# Patient Record
Sex: Female | Born: 1970
Health system: Southern US, Community
[De-identification: ages and names within clinical notes are randomized; demographics above are authoritative.]

## PROBLEM LIST (undated history)

## (undated) DIAGNOSIS — E669 Obesity, unspecified: Secondary | ICD-10-CM

## (undated) DIAGNOSIS — E785 Hyperlipidemia, unspecified: Secondary | ICD-10-CM

## (undated) DIAGNOSIS — K529 Noninfective gastroenteritis and colitis, unspecified: Secondary | ICD-10-CM

## (undated) DIAGNOSIS — E119 Type 2 diabetes mellitus without complications: Secondary | ICD-10-CM

## (undated) DIAGNOSIS — K635 Polyp of colon: Secondary | ICD-10-CM

## (undated) DIAGNOSIS — J189 Pneumonia, unspecified organism: Secondary | ICD-10-CM

## (undated) HISTORY — DX: Hyperlipidemia, unspecified: E78.5

## (undated) HISTORY — PX: ABLATION: SHX5711

## (undated) HISTORY — DX: Noninfective gastroenteritis and colitis, unspecified: K52.9

## (undated) HISTORY — DX: Pneumonia, unspecified organism: J18.9

## (undated) HISTORY — DX: Polyp of colon: K63.5

## (undated) HISTORY — DX: Obesity, unspecified: E66.9

## (undated) HISTORY — DX: Type 2 diabetes mellitus without complications: E11.9

---

## 2006-09-28 HISTORY — PX: COLONOSCOPY: SHX174

## 2011-03-05 HISTORY — PX: ESOPHAGOGASTRODUODENOSCOPY: SHX1529

## 2016-03-17 HISTORY — PX: ABDOMINAL HYSTERECTOMY: SHX81

## 2017-06-01 ENCOUNTER — Ambulatory Visit (INDEPENDENT_AMBULATORY_CARE_PROVIDER_SITE_OTHER): Payer: BLUE CROSS/BLUE SHIELD | Admitting: Podiatry

## 2017-06-01 ENCOUNTER — Ambulatory Visit (INDEPENDENT_AMBULATORY_CARE_PROVIDER_SITE_OTHER): Payer: BLUE CROSS/BLUE SHIELD

## 2017-06-01 DIAGNOSIS — M79671 Pain in right foot: Secondary | ICD-10-CM

## 2017-06-01 DIAGNOSIS — M205X1 Other deformities of toe(s) (acquired), right foot: Secondary | ICD-10-CM

## 2017-06-01 DIAGNOSIS — M21962 Unspecified acquired deformity of left lower leg: Secondary | ICD-10-CM

## 2017-06-01 DIAGNOSIS — M779 Enthesopathy, unspecified: Secondary | ICD-10-CM

## 2017-06-01 DIAGNOSIS — M659 Synovitis and tenosynovitis, unspecified: Secondary | ICD-10-CM

## 2017-06-01 DIAGNOSIS — M7750 Other enthesopathy of unspecified foot: Secondary | ICD-10-CM | POA: Diagnosis not present

## 2017-06-02 NOTE — Progress Notes (Signed)
  Subjective:  Patient ID: Tammy Christensen, female    DOB: 1970-10-28,  MRN: [id number]  Chief Complaint  Patient presents with  . Toe Injury    R grea toe injury (I was falling and I put all my weight on my toe) x couple months; 5/10 achy pain Tx: none    47 y.o. female presents with the above complaint.  Reports right great toe injury couple months ago.  States that all of her weight went through her right great toe and describes a hyperextension type injury through the toe.  Reports 7 out of 10 sharp pain.  Denies prior treatments.  Denies nausea vomiting fever chills  No past medical history on file.  Current Outpatient Medications:  .  esomeprazole (NEXIUM) 20 MG packet, Take 20 mg by mouth daily before breakfast., Disp: , Rfl:   Not on File Review of Systems Objective:  There were no vitals filed for this visit. General AA&O x3. Normal mood and affect.  Vascular Dorsalis pedis and posterior tibial pulses  present 2+ bilaterally  Capillary refill normal to all digits. Pedal hair growth normal.  Neurologic Epicritic sensation grossly present.  Dermatologic No open lesions. Interspaces clear of maceration. Nails well groomed and normal in appearance.  Orthopedic: MMT 5/5 in dorsiflexion, plantarflexion, inversion, and eversion. Pain on range of motion right first MPJ.  Decreased range of motion with foot loaded.  Dorsal medial prominence noted about the first MPJ.   Assessment & Plan:  Patient was evaluated and treated and all questions answered.  Hallux limitus right first MPJ -X-rays reviewed.  Met primus elevatus with subchondral sclerosis still appreciable joint space first MPJ -Discussed etiology of hallux limitus.  Discussed the patient that this could have been exacerbated by a turf toe type injury that destabilize the joint. -Injection delivered right first MPJ as below -Would consider orthotics with possible reverse Morton's extension.  Will discuss at next  visit  Procedure: Joint Injection Location: Right first MPJ joint Skin Prep: Alcohol. Injectate: 0.5 cc 1% lidocaine plain, 0.5 cc dexamethasone phosphate. Disposition: Patient tolerated procedure well. Injection site dressed with a band-aid.    Return in about 3 weeks (around 06/22/2017) for Hallux Limitus / Turf Toe.

## 2017-06-22 ENCOUNTER — Ambulatory Visit (INDEPENDENT_AMBULATORY_CARE_PROVIDER_SITE_OTHER): Payer: BLUE CROSS/BLUE SHIELD | Admitting: Podiatry

## 2017-06-22 ENCOUNTER — Encounter: Payer: Self-pay | Admitting: Podiatry

## 2017-06-22 DIAGNOSIS — M659 Synovitis and tenosynovitis, unspecified: Secondary | ICD-10-CM | POA: Diagnosis not present

## 2017-06-22 DIAGNOSIS — M21962 Unspecified acquired deformity of left lower leg: Secondary | ICD-10-CM | POA: Diagnosis not present

## 2017-06-22 DIAGNOSIS — M205X1 Other deformities of toe(s) (acquired), right foot: Secondary | ICD-10-CM

## 2017-06-22 NOTE — Progress Notes (Signed)
  Subjective:  Patient ID: Tammy Christensen, female    DOB: September 14, 1970,  MRN: [id number]  Chief Complaint  Patient presents with  . hallux limitus    F/U Hallux limitus R 1st toe Pt. stated," it's fine, it doesn't hurt as much as it used to; 3/10 pain. Also, I've been having this burning sensation on my great toe." Tx: none   47 y.o. female returns for the above complaint.  States that the area is not hurting as much as it used to reportedly 3 of 10 pain.  Reports burning sensation to the toe that she says is deep within the joint.  Objective:  There were no vitals filed for this visit. General AA&O x3. Normal mood and affect.  Vascular Pedal pulses palpable.  Neurologic Epicritic sensation grossly intact.  Dermatologic No open lesions. Skin normal texture and turgor.  Orthopedic: Pain on ROM 1st MPJ.  Dorsal medial eminence noted about the first MPJ   Assessment & Plan:  Patient was evaluated and treated and all questions answered.  Hallux Limitus R 1st MPJ -Pain improving.  No repeat injection today. -Discussed proper shoe gear including wearing sneakers.  Patient states she does not wear sneakers very often and most often wears wedge shoes.  Discussed possible orthotic therapy with Morton's extension however patient states that she does not wear sneakers often enough to make this justifiable.  Will hold off at this time.  Discussed possible surgical therapy including cheilectomy however patient does not wish to proceed at this time.  Will monitor -For signs of worsening pain.  Will consider in the future.  15 minutes of face to face time were spent with the patient. >50% of this was spent on counseling and coordination of care. Specifically discussed with patient the above diagnosis and treatment plans.   Return in about 6 weeks (around 08/03/2017) for Hallux Limitus R.

## 2017-07-16 ENCOUNTER — Other Ambulatory Visit: Payer: Self-pay | Admitting: Podiatry

## 2017-07-16 DIAGNOSIS — M205X1 Other deformities of toe(s) (acquired), right foot: Secondary | ICD-10-CM

## 2017-08-03 ENCOUNTER — Ambulatory Visit: Payer: BLUE CROSS/BLUE SHIELD | Admitting: Podiatry

## 2017-12-30 ENCOUNTER — Encounter: Payer: Self-pay | Admitting: Gastroenterology

## 2018-01-04 ENCOUNTER — Encounter: Payer: Self-pay | Admitting: Gastroenterology

## 2018-01-05 ENCOUNTER — Encounter: Payer: Self-pay | Admitting: Gastroenterology

## 2018-01-05 ENCOUNTER — Ambulatory Visit (INDEPENDENT_AMBULATORY_CARE_PROVIDER_SITE_OTHER): Payer: BLUE CROSS/BLUE SHIELD | Admitting: Gastroenterology

## 2018-01-05 VITALS — BP 138/88 | HR 84 | Ht 61.0 in | Wt 225.1 lb

## 2018-01-05 DIAGNOSIS — R1310 Dysphagia, unspecified: Secondary | ICD-10-CM

## 2018-01-05 DIAGNOSIS — Z8371 Family history of colonic polyps: Secondary | ICD-10-CM | POA: Diagnosis not present

## 2018-01-05 DIAGNOSIS — K219 Gastro-esophageal reflux disease without esophagitis: Secondary | ICD-10-CM

## 2018-01-05 MED ORDER — SUPREP BOWEL PREP KIT 17.5-3.13-1.6 GM/177ML PO SOLN: 1.0000 | ORAL | 0 refills | Status: DC

## 2018-01-05 MED ORDER — PANTOPRAZOLE SODIUM 40 MG PO TBEC: 40.0000 mg | DELAYED_RELEASE_TABLET | Freq: Every day | ORAL | 11 refills | Status: AC

## 2018-01-05 NOTE — Patient Instructions (Addendum)
If you are age 48 or older, your body mass index should be between 23-30. Your Body mass index is 42.54 kg/m. If this is out of the aforementioned range listed, please consider follow up with your Primary Care Provider.  If you are age 37 or younger, your body mass index should be between 19-25. Your Body mass index is 42.54 kg/m. If this is out of the aformentioned range listed, please consider follow up with your Primary Care Provider.   You have been scheduled for an endoscopy and colonoscopy. Please follow the written instructions given to you at your visit today. Please pick up your prep supplies at the pharmacy within the next 1-3 days. If you use inhalers (even only as needed), please bring them with you on the day of your procedure. Your physician has requested that you go to www.startemmi.com and enter the access code given to you at your visit today. This web site gives a general overview about your procedure. However, you should still follow specific instructions given to you by our office regarding your preparation for the procedure.  We have sent the following medications to your pharmacy for you to pick up at your convenience: Protonix 40 mg once daily Suprep   Thank you,  Dr. Lynann Bologna

## 2018-01-05 NOTE — Progress Notes (Addendum)
Chief Complaint:   Referring Provider:  Hurshel Party, NP      ASSESSMENT AND PLAN;   #1. GERD (H/O LPR) #2. Esophageal dysphagia. Differential diagnoses includes esophageal stricture, Schatzki's ring, motility disorder, eosinophilic esophagitis, pill induced esophagitis, rule out esophageal carcinoma or extrinsic lesions. #3. FH of polyps (mom)  Plan: - Change nexium to protonix 40mg  once a day.  If she still has problems, add Zantac or Pepcid nightly. - Nonpharmacologic means of reflux control was stressed. - No sodas, chocolates, chewing gums and candy. - Weight loss-encouraged patient to continue losing weight gradually. - Proceed with EGD with possible esophageal biopsies/dilation and colonoscopy.  I have discussed the risks and benefits.  The risks including risk of perforation requiring laparotomy, bleeding after biopsies/polypectomy requiring blood transfusions and risks of anesthesia/sedation were discussed.  Rare risks of missing UGI and colorectal neoplasms were also discussed.  Alternatives were also given.  Patient is fully aware and agrees to proceed. All the questions were answered. Procedures will be scheduled in upcoming days. Consent forms were given for review. - If still with problems, consider ENT evaluation.     HPI:    Tammy Christensen is a 47 y.o. female  Seen at request of Amy Moon FNP With long-standing history of gastroesophageal reflux Despite Nexium Getting worse Has regurgitation Has been having problems with dysphagia mostly to solids in the lower neck area No melena or hematochezia Seen by Gaye Alken advised to get EGD and colonoscopy performed. Denies having any lower GI symptoms including diarrhea or constipation. No significant abdominal pain, nausea/vomiting. Has been trying to lose weight and has been able to lose 8 pounds over the last 1 year. Denies use of nonsteroidals Denies eating late at night No significant sodas, chocolates,  coffee. Used to be a CNA at [employer], now real estate agent with [employer] Likes her job Sleeping well Mom is a patient of ours Past GI procedures: -Colonoscopy 09/2006 ([facility])-negative. -EGD 02/2011-irregular Z line, biopsies negative negative for Barrett's, small bowel biopsies negative for celiac Past Medical History:  Diagnosis Date  . Colitis   . Colon polyp   . Diabetes (HCC)   . Hyperlipidemia   . Obesity   . Pneumonia     Past Surgical History:  Procedure Laterality Date  . ABDOMINAL HYSTERECTOMY  2018  . ABLATION    . COLONOSCOPY  09/28/2006   Minimal erythema ileum of questionable importance. Otherwise normal colonoscopy.   . ESOPHAGOGASTRODUODENOSCOPY  03/05/2011   Irregular Z-line suggestive of gerd. Otherwise noraml EGD.     Family History  Problem Relation Age of Onset  . Esophageal cancer Mother   . Diabetes Father   . Diabetes Paternal Grandmother   . Diabetes Paternal Grandfather   . Colon cancer Neg Hx     Social History   Tobacco Use  . Smoking status: Former Games developer  . Smokeless tobacco: Never Used  . Tobacco comment: quit 20 years ago  Substance Use Topics  . Alcohol use: Not Currently  . Drug use: Never    Current Outpatient Medications  Medication Sig Dispense Refill  . esomeprazole (NEXIUM) 20 MG packet Take 20 mg by mouth daily before breakfast.    . JANUMET XR [id number] MG TB24 1 tablet daily.   0   No current facility-administered medications for this visit.     Not on File  Review of Systems:  Constitutional: Denies fever, chills, diaphoresis, appetite change and has fatigue.  HEENT: Denies  photophobia, eye pain, redness, hearing loss, ear pain, congestion, sore throat, rhinorrhea, sneezing, mouth sores, neck pain, neck stiffness and tinnitus.   Respiratory: Has SOB, NO DOE, cough, chest tightness,  and wheezing.   Cardiovascular: Denies chest pain, palpitations and leg swelling.  Genitourinary: Denies dysuria, urgency,  frequency, hematuria, flank pain and difficulty urinating.  Musculoskeletal: Denies myalgias, back pain, joint swelling, arthralgias and gait problem.  Skin: No rash.  Neurological: Denies dizziness, seizures, syncope, weakness, light-headedness, numbness and headaches.  Hematological: Denies adenopathy. Easy bruising, personal or family bleeding history  Psychiatric/Behavioral: No anxiety or depression.  Has sleeping problems     Physical Exam:    BP 138/88   Pulse 84   Ht 5\' 1"  (1.549 m)   Wt 225 lb 2 oz (102.1 kg)   BMI 42.54 kg/m  Filed Weights   01/05/18 1041  Weight: 225 lb 2 oz (102.1 kg)   Constitutional:  Well-developed, in no acute distress. Psychiatric: Normal mood and affect. Behavior is normal. HEENT: Pupils normal.  Conjunctivae are normal. No scleral icterus. Neck supple.  Cardiovascular: Normal rate, regular rhythm. No edema Pulmonary/chest: Effort normal and breath sounds normal. No wheezing, rales or rhonchi. Abdominal: Soft, nondistended. Nontender. Bowel sounds active throughout. There are no masses palpable. No hepatomegaly. Rectal:  defered Neurological: Alert and oriented to person place and time. Skin: Skin is warm and dry. No rashes noted.    Edman Circle, MD 01/05/2018, 11:07 AM  Cc: Hurshel Party, NP

## 2018-01-11 ENCOUNTER — Encounter: Payer: Self-pay | Admitting: Gastroenterology

## 2018-01-12 ENCOUNTER — Encounter: Payer: Self-pay | Admitting: Gastroenterology

## 2018-01-12 ENCOUNTER — Ambulatory Visit (AMBULATORY_SURGERY_CENTER): Payer: BLUE CROSS/BLUE SHIELD | Admitting: Gastroenterology

## 2018-01-12 VITALS — BP 144/81 | HR 83 | Temp 97.8°F | Resp 14 | Ht 61.0 in | Wt 225.0 lb

## 2018-01-12 DIAGNOSIS — Z8371 Family history of colonic polyps: Secondary | ICD-10-CM | POA: Diagnosis not present

## 2018-01-12 DIAGNOSIS — K2970 Gastritis, unspecified, without bleeding: Secondary | ICD-10-CM

## 2018-01-12 DIAGNOSIS — Z1211 Encounter for screening for malignant neoplasm of colon: Secondary | ICD-10-CM | POA: Diagnosis not present

## 2018-01-12 DIAGNOSIS — R1319 Other dysphagia: Secondary | ICD-10-CM | POA: Diagnosis present

## 2018-01-12 MED ORDER — SODIUM CHLORIDE 0.9 % IV SOLN: 500.0000 mL | Freq: Once | INTRAVENOUS | Status: AC

## 2018-01-12 NOTE — Patient Instructions (Signed)
YOU HAD AN ENDOSCOPIC PROCEDURE TODAY AT THE [facility]:   Refer to the procedure report that was given to you for any specific questions about what was found during the examination.  If the procedure report does not answer your questions, please call your gastroenterologist to clarify.  If you requested that your care partner not be given the details of your procedure findings, then the procedure report has been included in a sealed envelope for you to review at your convenience later.  YOU SHOULD EXPECT: Some feelings of bloating in the abdomen. Passage of more gas than usual.  Walking can help get rid of the air that was put into your GI tract during the procedure and reduce the bloating. If you had a lower endoscopy (such as a colonoscopy or flexible sigmoidoscopy) you may notice spotting of blood in your stool or on the toilet paper. If you underwent a bowel prep for your procedure, you may not have a normal bowel movement for a few days.  Please Note:  You might notice some irritation and congestion in your nose or some drainage.  This is from the oxygen used during your procedure.  There is no need for concern and it should clear up in a day or so.  SYMPTOMS TO REPORT IMMEDIATELY:   Following lower endoscopy (colonoscopy or flexible sigmoidoscopy):  Excessive amounts of blood in the stool  Significant tenderness or worsening of abdominal pains  Swelling of the abdomen that is new, acute  Fever of 100F or higher   Following upper endoscopy (EGD)  Vomiting of blood or coffee ground material  New chest pain or pain under the shoulder blades  Painful or persistently difficult swallowing  New shortness of breath  Fever of 100F or higher  Black, tarry-looking stools  For urgent or emergent issues, a gastroenterologist can be reached at any hour by calling [phone].   DIET:  Post Esophageal Dilation Diet.  Drink plenty of fluids but you should avoid alcoholic  beverages for 24 hours.  ACTIVITY:  You should plan to take it easy for the rest of today and you should NOT DRIVE or use heavy machinery until tomorrow (because of the sedation medicines used during the test).    FOLLOW UP: Our staff will call the number listed on your records the next business day following your procedure to check on you and address any questions or concerns that you may have regarding the information given to you following your procedure. If we do not reach you, we will leave a message.  However, if you are feeling well and you are not experiencing any problems, there is no need to return our call.  We will assume that you have returned to your regular daily activities without incident.  If any biopsies were taken you will be contacted by phone or by letter within the next 1-3 weeks.  Please call us at [phone] if you have not heard about the biopsies in 3 weeks.  Await for biopsy results  Post esophageal Dilation Diet (handout given) Esophagitis and Stricture (handout given)  SIGNATURES/CONFIDENTIALITY: You and/or your care partner have signed paperwork which will be entered into your electronic medical record.  These signatures attest to the fact that that the information above on your After Visit Summary has been reviewed and is understood.  Full responsibility of the confidentiality of this discharge information lies with you and/or your care-partner.

## 2018-01-12 NOTE — Progress Notes (Signed)
Called to room to assist during endoscopic procedure.  Patient ID and intended procedure confirmed with present staff. Received instructions for my participation in the procedure from the performing physician.  

## 2018-01-12 NOTE — Op Note (Signed)
[facility] Patient Name: Tammy Christensen Procedure Date: 01/12/2018 11:11 AM MRN: [id number] Endoscopist: Lynann Bologna , MD Age: 47 Referring MD:  Date of Birth: [birth year] Gender: Female Account #: [id number] Procedure:                Colonoscopy Indications:              Colon cancer screening in patient at increased                            risk: Family history of 1st-degree relative with                            colon polyps before age 6 years Medicines:                Monitored Anesthesia Care Procedure:                Pre-Anesthesia Assessment:                           - Prior to the procedure, a History and Physical                            was performed, and patient medications and                            allergies were reviewed. The patient's tolerance of                            previous anesthesia was also reviewed. The risks                            and benefits of the procedure and the sedation                            options and risks were discussed with the patient.                            All questions were answered, and informed consent                            was obtained. Prior Anticoagulants: The patient has                            taken no previous anticoagulant or antiplatelet                            agents. ASA Grade Assessment: III - A patient with                            severe systemic disease. After reviewing the risks                            and benefits, the patient was deemed in  satisfactory condition to undergo the procedure.                           After obtaining informed consent, the colonoscope                            was passed under direct vision. Throughout the                            procedure, the patient's blood pressure, pulse, and                            oxygen saturations were monitored continuously. The                            Colonoscope was introduced  through the anus and                            advanced to the 2 cm into the ileum. The                            colonoscopy was performed without difficulty. The                            patient tolerated the procedure well. The quality                            of the bowel preparation was excellent. Scope In: 11:36:03 AM Scope Out: 11:45:58 AM Scope Withdrawal Time: 0 hours 7 minutes 0 seconds  Total Procedure Duration: 0 hours 9 minutes 55 seconds  Findings:                 Non-bleeding internal hemorrhoids and external                            hemorrhoids were found during retroflexion and on                            perianal exam. The hemorrhoids were small.                           The exam was otherwise without abnormality on                            direct and retroflexion views. Complications:            No immediate complications. Estimated Blood Loss:     Estimated blood loss: none. Impression:               - Small non-bleeding internal hemorrhoids.                           - The examination was otherwise normal on direct                            and retroflexion  views.                           - No specimens collected. Recommendation:           - Patient has a contact number available for                            emergencies. The signs and symptoms of potential                            delayed complications were discussed with the                            patient. Return to normal activities tomorrow.                            Written discharge instructions were provided to the                            patient.                           - Resume previous diet.                           - Continue present medications.                           - Repeat colonoscopy in 10 years for screening                            purposes.                           - Return to GI clinic PRN. Lynann Bologna, MD 01/12/2018 11:57:32 AM This report has been signed  electronically.

## 2018-01-12 NOTE — Op Note (Signed)
[facility] Patient Name: Tammy Christensen Procedure Date: 01/12/2018 11:11 AM MRN: [id number] Endoscopist: Lynann Bologna , MD Age: 47 Referring MD:  Date of Birth: May 20, 1970 Gender: Female Account #: [id number] Procedure:                Upper GI endoscopy Indications:              Gastroesophageal reflux with intermittent dysphagia. Medicines:                Monitored Anesthesia Care Procedure:                Pre-Anesthesia Assessment:                           - Prior to the procedure, a History and Physical                            was performed, and patient medications and                            allergies were reviewed. The patient's tolerance of                            previous anesthesia was also reviewed. The risks                            and benefits of the procedure and the sedation                            options and risks were discussed with the patient.                            All questions were answered, and informed consent                            was obtained. Prior Anticoagulants: The patient has                            taken no previous anticoagulant or antiplatelet                            agents. ASA Grade Assessment: III - A patient with                            severe systemic disease. After reviewing the risks                            and benefits, the patient was deemed in                            satisfactory condition to undergo the procedure.                           After obtaining informed consent, the endoscope was  passed under direct vision. Throughout the                            procedure, the patient's blood pressure, pulse, and                            oxygen saturations were monitored continuously. The                            Endoscope was introduced through the mouth, and                            advanced to the second part of duodenum. The upper   GI endoscopy was accomplished without difficulty.                            The patient tolerated the procedure well. Scope In: Scope Out: Findings:                 No endoscopic abnormality was evident in the                            esophagus to explain the patient's complaint of                            dysphagia. It was decided, however, to proceed with                            dilation of the entire esophagus. The scope was                            withdrawn. Dilation was performed with a Maloney                            dilator with no resistance at 50 Fr. Biopsies were                            taken with a cold forceps for histology to r/o EoE.                            Z line well-defined at 35 cm.                           Mild inflammation was found in the gastric antrum.                            Biopsies were taken with a cold forceps for                            histology. Estimated blood loss: none.                           The examined duodenum was normal. Complications:  No immediate complications. Estimated Blood Loss:     Estimated blood loss: none. Impression:               - Mild Gastritis. Biopsied.                           - S/P empiric esophageal dilatation. Recommendation:           - Patient has a contact number available for                            emergencies. The signs and symptoms of potential                            delayed complications were discussed with the                            patient. Return to normal activities tomorrow.                            Written discharge instructions were provided to the                            patient.                           - Post dilatation diet.                           - Continue present medications.                           - Await pathology results.                           - Return to GI office PRN. Lynann Bologna, MD 01/12/2018 11:53:09 AM This report has been  signed electronically.

## 2018-01-12 NOTE — Progress Notes (Signed)
PT taken to PACU. Monitors in place. VSS. Report given to RN. 

## 2018-01-13 ENCOUNTER — Telehealth: Payer: Self-pay

## 2018-01-13 NOTE — Telephone Encounter (Signed)
  Follow up Call-  Call back number 01/12/2018  Post procedure Call Back phone  # [phone]  Permission to leave phone message Yes     Patient questions:  Do you have a fever, pain , or abdominal swelling? No Pain Score  0  Have you tolerated food without any problems? Yes  Have you been able to return to your normal activities? Yes  Do you have any questions about your discharge instructions: Diet   No Medications  No Follow up visit  No  Do you have questions or concerns about your Care? No  Actions: * If pain score is 4 or above: "No action needed, pain <4."

## 2018-01-15 ENCOUNTER — Encounter: Payer: Self-pay | Admitting: Gastroenterology

## 2020-06-27 ENCOUNTER — Other Ambulatory Visit: Payer: Self-pay | Admitting: Internal Medicine

## 2020-06-28 ENCOUNTER — Other Ambulatory Visit: Payer: Self-pay | Admitting: Internal Medicine

## 2020-06-28 DIAGNOSIS — R928 Other abnormal and inconclusive findings on diagnostic imaging of breast: Secondary | ICD-10-CM

## 2020-07-04 ENCOUNTER — Ambulatory Visit
Admission: RE | Admit: 2020-07-04 | Discharge: 2020-07-04 | Disposition: A | Discharge status: Home / Self Care | Payer: Managed Care, Other (non HMO) | Source: Ambulatory Visit | Attending: Internal Medicine | Admitting: Internal Medicine

## 2020-07-04 ENCOUNTER — Other Ambulatory Visit: Payer: Self-pay

## 2020-07-04 DIAGNOSIS — R928 Other abnormal and inconclusive findings on diagnostic imaging of breast: Secondary | ICD-10-CM

## 2020-07-27 ENCOUNTER — Ambulatory Visit: Payer: Self-pay | Admitting: Surgery

## 2020-07-27 DIAGNOSIS — N6489 Other specified disorders of breast: Secondary | ICD-10-CM

## 2020-07-27 NOTE — H&P (View-Only) (Signed)
History of Present Illness Tammy Christensen. Tammy Blando MD; 07/27/2020 2:49 PM) The patient is a 50 year old female who presents with a breast mass. PCP - Tammy Alken NP Reason: Right breast CSL  This is a 50 year old female in good health who presents after a recent screening mammogram at [facility] revealed a possible mass in the right lateral breast. She came to [facility] for further work-up. Biopsy revealed a complex sclerosing lesion without sign of any malignancy. No family history of breast cancer. No previous breast problems. She presents now to discuss excision of this area.     Problem List/Past Medical Molli Hazard K. Levy Cedano, MD; 07/27/2020 2:49 PM) COMPLEX SCLEROSING LESION OF RIGHT BREAST (Y63.78)  Past Surgical History Michel Bickers, LPN; 5/88/5027 74:12 AM) Breast Biopsy Right.  Diagnostic Studies History Michel Bickers, LPN; 8/78/6767 20:94 AM) Mammogram within last year Pap Smear 1-5 years ago  Allergies Arbutus Ped, CMA; 07/27/2020 10:16 AM) No Known Drug Allergies [07/27/2020]: Allergies Reconciled  Medication History Arbutus Ped, CMA; 07/27/2020 10:16 AM) Trulicity (1.5MG /0.5ML Soln Pen-inj, Subcutaneous) Active. Medications Reconciled  Social History Michel Bickers, LPN; 09/22/6281 66:29 AM) Alcohol use Remotely quit alcohol use. Caffeine use Coffee. No drug use Tobacco use Former smoker.  Family History Michel Bickers, LPN; 4/76/5465 03:54 AM) Arthritis Mother. Depression Mother. Hypertension Mother. Thyroid problems Mother.  Pregnancy / Birth History Michel Bickers, LPN; 6/56/8127 51:70 AM) Age at menarche 10 years. Gravida 1 Para 1  Other Problems Tammy Christensen. Shaquon Gropp, MD; 07/27/2020 2:49 PM) Gastroesophageal Reflux Disease Hemorrhoids     Review of Systems Tresa Endo Dockery LPN; 0/17/4944 96:75 AM) General Not Present- Appetite Loss, Chills, Fatigue, Fever, Night Sweats, Weight Gain and Weight Loss. Respiratory Not Present- Bloody sputum, Chronic  Cough, Difficulty Breathing, Snoring and Wheezing. Cardiovascular Not Present- Chest Pain, Difficulty Breathing Lying Down, Leg Cramps, Palpitations, Rapid Heart Rate, Shortness of Breath and Swelling of Extremities. Gastrointestinal Present- Hemorrhoids. Not Present- Abdominal Pain, Bloating, Bloody Stool, Change in Bowel Habits, Chronic diarrhea, Constipation, Difficulty Swallowing, Excessive gas, Gets full quickly at meals, Indigestion, Nausea, Rectal Pain and Vomiting.  Vitals Arbutus Ped CMA; 07/27/2020 10:17 AM) 07/27/2020 10:16 AM Weight: 200.13 lb Height: 61in Body Surface Area: 1.89 m Body Mass Index: 37.81 kg/m  Temp.: 96.40F  Pulse: 71 (Regular)  P.OX: 98% (Room air) BP: 136/78(Sitting, Left Arm, Standard)        Physical Exam Molli Hazard K. Brindy Higginbotham MD; 07/27/2020 2:49 PM)  The physical exam findings are as follows: Note:Constitutional: WDWN in NAD, conversant, no obvious deformities; resting comfortably Eyes: Pupils equal, round; sclera anicteric; moist conjunctiva; no lid lag HENT: Oral mucosa moist; good dentition Neck: No masses palpated, trachea midline; no thyromegaly Lungs: CTA bilaterally; normal respiratory effort Breasts: symmetric; no palpable masses; no nipple changes; no axillary lymphadenopathy CV: Regular rate and rhythm; no murmurs; extremities well-perfused with no edema Abd: +bowel sounds, soft, non-tender, no palpable organomegaly; no palpable hernias Musc: Normal gait; no apparent clubbing or cyanosis in extremities Lymphatic: No palpable cervical or axillary lymphadenopathy Skin: Warm, dry; no sign of jaundice Psychiatric - alert and oriented x 4; calm mood and affect    Assessment & Plan Molli Hazard K. Jamaica Inthavong MD; 07/27/2020 10:37 AM)  COMPLEX SCLEROSING LESION OF RIGHT BREAST (N64.89)  Current Plans Schedule for Surgery - Right breast radioactive seed localized lumpectomy. The surgical procedure has been discussed with the patient. Potential  risks, benefits, alternative treatments, and expected outcomes have been explained. All of the patient's questions at this time have been answered.  The likelihood of reaching the patient's treatment goal is good. The patient understand the proposed surgical procedure and wishes to proceed.   Tammy Christensen. Corliss Skains, MD, [facility]  General/ Trauma Surgery   07/27/2020 2:50 PM

## 2020-07-27 NOTE — H&P (Signed)
History of Present Illness Tammy Christensen. Annalea Alguire MD; 07/27/2020 2:49 PM) The patient is a 50 year old female who presents with a breast mass. PCP - Gaye Alken NP Reason: Right breast CSL  This is a 50 year old female in good health who presents after a recent screening mammogram at [facility] revealed a possible mass in the right lateral breast. She came to [facility] for further work-up. Biopsy revealed a complex sclerosing lesion without sign of any malignancy. No family history of breast cancer. No previous breast problems. She presents now to discuss excision of this area.     Problem List/Past Medical Molli Hazard K. Rimsha Trembley, MD; 07/27/2020 2:49 PM) COMPLEX SCLEROSING LESION OF RIGHT BREAST (Y63.78)  Past Surgical History Michel Bickers, LPN; 5/88/5027 74:12 AM) Breast Biopsy Right.  Diagnostic Studies History Michel Bickers, LPN; 8/78/6767 20:94 AM) Mammogram within last year Pap Smear 1-5 years ago  Allergies Arbutus Ped, CMA; 07/27/2020 10:16 AM) No Known Drug Allergies [07/27/2020]: Allergies Reconciled  Medication History Arbutus Ped, CMA; 07/27/2020 10:16 AM) Trulicity (1.5MG /0.5ML Soln Pen-inj, Subcutaneous) Active. Medications Reconciled  Social History Michel Bickers, LPN; 09/22/6281 66:29 AM) Alcohol use Remotely quit alcohol use. Caffeine use Coffee. No drug use Tobacco use Former smoker.  Family History Michel Bickers, LPN; 4/76/5465 03:54 AM) Arthritis Mother. Depression Mother. Hypertension Mother. Thyroid problems Mother.  Pregnancy / Birth History Michel Bickers, LPN; 6/56/8127 51:70 AM) Age at menarche 10 years. Gravida 1 Para 1  Other Problems Tammy Christensen. Lelaina Oatis, MD; 07/27/2020 2:49 PM) Gastroesophageal Reflux Disease Hemorrhoids     Review of Systems Tresa Endo Dockery LPN; 0/17/4944 96:75 AM) General Not Present- Appetite Loss, Chills, Fatigue, Fever, Night Sweats, Weight Gain and Weight Loss. Respiratory Not Present- Bloody sputum, Chronic  Cough, Difficulty Breathing, Snoring and Wheezing. Cardiovascular Not Present- Chest Pain, Difficulty Breathing Lying Down, Leg Cramps, Palpitations, Rapid Heart Rate, Shortness of Breath and Swelling of Extremities. Gastrointestinal Present- Hemorrhoids. Not Present- Abdominal Pain, Bloating, Bloody Stool, Change in Bowel Habits, Chronic diarrhea, Constipation, Difficulty Swallowing, Excessive gas, Gets full quickly at meals, Indigestion, Nausea, Rectal Pain and Vomiting.  Vitals Arbutus Ped CMA; 07/27/2020 10:17 AM) 07/27/2020 10:16 AM Weight: 200.13 lb Height: 61in Body Surface Area: 1.89 m Body Mass Index: 37.81 kg/m  Temp.: 96.40F  Pulse: 71 (Regular)  P.OX: 98% (Room air) BP: 136/78(Sitting, Left Arm, Standard)        Physical Exam Molli Hazard K. Kaityln Kallstrom MD; 07/27/2020 2:49 PM)  The physical exam findings are as follows: Note:Constitutional: WDWN in NAD, conversant, no obvious deformities; resting comfortably Eyes: Pupils equal, round; sclera anicteric; moist conjunctiva; no lid lag HENT: Oral mucosa moist; good dentition Neck: No masses palpated, trachea midline; no thyromegaly Lungs: CTA bilaterally; normal respiratory effort Breasts: symmetric; no palpable masses; no nipple changes; no axillary lymphadenopathy CV: Regular rate and rhythm; no murmurs; extremities well-perfused with no edema Abd: +bowel sounds, soft, non-tender, no palpable organomegaly; no palpable hernias Musc: Normal gait; no apparent clubbing or cyanosis in extremities Lymphatic: No palpable cervical or axillary lymphadenopathy Skin: Warm, dry; no sign of jaundice Psychiatric - alert and oriented x 4; calm mood and affect    Assessment & Plan Molli Hazard K. Danaysia Rader MD; 07/27/2020 10:37 AM)  COMPLEX SCLEROSING LESION OF RIGHT BREAST (N64.89)  Current Plans Schedule for Surgery - Right breast radioactive seed localized lumpectomy. The surgical procedure has been discussed with the patient. Potential  risks, benefits, alternative treatments, and expected outcomes have been explained. All of the patient's questions at this time have been answered.  The likelihood of reaching the patient's treatment goal is good. The patient understand the proposed surgical procedure and wishes to proceed.   Malloree Raboin K. Khaniya Tenaglia, MD, FACS [facility]  General/ Trauma Surgery   07/27/2020 2:50 PM  

## 2020-07-31 ENCOUNTER — Other Ambulatory Visit: Payer: Self-pay | Admitting: Surgery

## 2020-07-31 DIAGNOSIS — N6489 Other specified disorders of breast: Secondary | ICD-10-CM

## 2020-08-06 NOTE — Pre-Procedure Instructions (Signed)
Surgical Instructions    Your procedure is scheduled on Wednesday, June 1st.  Report to [facility] Main Entrance "A" at 7:45 A.M., then check in with the Admitting office.  Call this number if you have problems the morning of surgery:  [phone]   If you have any questions prior to your surgery date call [phone]: Open Monday-Friday 8am-4pm    Remember:  Do not eat after midnight the night before your surgery  You may drink clear liquids until 6:45 a.m. the morning of your surgery.   Clear liquids allowed are: Water, Non-Citrus Juices (without pulp), Carbonated Beverages, Clear Tea, Black Coffee Only, and Gatorade.    Take these medicines the morning of surgery with A SIP OF WATER  esomeprazole (NEXIUM)  As of today, STOP taking any Aspirin (unless otherwise instructed by your surgeon) Aleve, Naproxen, Ibuprofen, Motrin, Advil, Goody's, BC's, all herbal medications, fish oil, and all vitamins.  WHAT DO I DO ABOUT MY DIABETES MEDICATION?  Marland Kitchen The day of surgery, do not take other diabetes injectables, including Byetta (exenatide), Bydureon (exenatide ER), Victoza (liraglutide), or Trulicity (dulaglutide).   HOW TO MANAGE YOUR DIABETES BEFORE AND AFTER SURGERY  Why is it important to control my blood sugar before and after surgery? . Improving blood sugar levels before and after surgery helps healing and can limit problems. . A way of improving blood sugar control is eating a healthy diet by: o  Eating less sugar and carbohydrates o  Increasing activity/exercise o  Talking with your doctor about reaching your blood sugar goals . High blood sugars (greater than 180 mg/dL) can raise your risk of infections and slow your recovery, so you will need to focus on controlling your diabetes during the weeks before surgery. . Make sure that the doctor who takes care of your diabetes knows about your planned surgery including the date and location.  How do I manage my blood sugar  before surgery? . Check your blood sugar at least 4 times a day, starting 2 days before surgery, to make sure that the level is not too high or low.  . Check your blood sugar the morning of your surgery when you wake up and every 2 hours until you get to the Short Stay unit.  o If your blood sugar is less than 70 mg/dL, you will need to treat for low blood sugar: - Do not take insulin. - Treat a low blood sugar (less than 70 mg/dL) with  cup of clear juice (cranberry or apple), 4 glucose tablets, OR glucose gel. - Recheck blood sugar in 15 minutes after treatment (to make sure it is greater than 70 mg/dL). If your blood sugar is not greater than 70 mg/dL on recheck, call [phone] for further instructions. . Report your blood sugar to the short stay nurse when you get to Short Stay.  . If you are admitted to the hospital after surgery: o Your blood sugar will be checked by the staff and you will probably be given insulin after surgery (instead of oral diabetes medicines) to make sure you have good blood sugar levels. o The goal for blood sugar control after surgery is 80-180 mg/dL.                     Do NOT Smoke (Tobacco/Vaping) or drink Alcohol 24 hours prior to your procedure.  If you use a CPAP at night, you may bring all equipment for your overnight stay.   Contacts, glasses, piercing's,  hearing aid's, dentures or partials may not be worn into surgery, please bring cases for these belongings.    For patients admitted to the hospital, discharge time will be determined by your treatment team.   Patients discharged the day of surgery will not be allowed to drive home, and someone needs to stay with them for 24 hours.    Special instructions:   [facility]- Preparing For Surgery  Before surgery, you can play an important role. Because skin is not sterile, your skin needs to be as free of germs as possible. You can reduce the number of germs on your skin by washing with CHG  (chlorahexidine gluconate) Soap before surgery.  CHG is an antiseptic cleaner which kills germs and bonds with the skin to continue killing germs even after washing.    Oral Hygiene is also important to reduce your risk of infection.  Remember - BRUSH YOUR TEETH THE MORNING OF SURGERY WITH YOUR REGULAR TOOTHPASTE  Please do not use if you have an allergy to CHG or antibacterial soaps. If your skin becomes reddened/irritated stop using the CHG.  Do not shave (including legs and underarms) for at least 48 hours prior to first CHG shower. It is OK to shave your face.  Please follow these instructions carefully.   1. Shower the NIGHT BEFORE SURGERY and the MORNING OF SURGERY  2. If you chose to wash your hair, wash your hair first as usual with your normal shampoo.  3. After you shampoo, rinse your hair and body thoroughly to remove the shampoo.  4. Use CHG Soap as you would any other liquid soap. You can apply CHG directly to the skin and wash gently with a scrungie or a clean washcloth.   5. Apply the CHG Soap to your body ONLY FROM THE NECK DOWN.  Do not use on open wounds or open sores. Avoid contact with your eyes, ears, mouth and genitals (private parts). Wash Face and genitals (private parts)  with your normal soap.   6. Wash thoroughly, paying special attention to the area where your surgery will be performed.  7. Thoroughly rinse your body with warm water from the neck down.  8. DO NOT shower/wash with your normal soap after using and rinsing off the CHG Soap.  9. Pat yourself dry with a CLEAN TOWEL.  10. Wear CLEAN PAJAMAS to bed the night before surgery  11. Place CLEAN SHEETS on your bed the night before your surgery  12. DO NOT SLEEP WITH PETS.   Day of Surgery: Shower with CHG soap. Do not wear jewelry, make up, or nail polish Do not wear lotions, powders, perfumes, or deodorant. Do not shave 48 hours prior to surgery.   Do not bring valuables to the hospital. [facility] is not responsible for any belongings or valuables. Wear Clean/Comfortable clothing the morning of surgery Remember to brush your teeth WITH YOUR REGULAR TOOTHPASTE.   Please read over the following fact sheets that you were given.

## 2020-08-07 ENCOUNTER — Encounter (HOSPITAL_COMMUNITY)
Admission: RE | Admit: 2020-08-07 | Discharge: 2020-08-07 | Disposition: A | Discharge status: Home / Self Care | Payer: Managed Care, Other (non HMO) | Source: Ambulatory Visit | Attending: Surgery | Admitting: Surgery

## 2020-08-07 ENCOUNTER — Encounter (HOSPITAL_COMMUNITY): Payer: Self-pay

## 2020-08-07 ENCOUNTER — Other Ambulatory Visit: Payer: Self-pay

## 2020-08-07 DIAGNOSIS — Z01818 Encounter for other preprocedural examination: Secondary | ICD-10-CM | POA: Insufficient documentation

## 2020-08-07 DIAGNOSIS — I4510 Unspecified right bundle-branch block: Secondary | ICD-10-CM | POA: Insufficient documentation

## 2020-08-07 LAB — CBC
HCT: 41 % (ref 36.0–46.0)
Hemoglobin: 13.4 g/dL (ref 12.0–15.0)
MCH: 27.6 pg (ref 26.0–34.0)
MCHC: 32.7 g/dL (ref 30.0–36.0)
MCV: 84.5 fL (ref 80.0–100.0)
Platelets: 310 K/uL (ref 150–400)
RBC: 4.85 MIL/uL (ref 3.87–5.11)
RDW: 12.1 % (ref 11.5–15.5)
WBC: 8.9 K/uL (ref 4.0–10.5)
nRBC: 0 % (ref 0.0–0.2)

## 2020-08-07 LAB — BASIC METABOLIC PANEL WITH GFR
Anion gap: 8 (ref 5–15)
BUN: 13 mg/dL (ref 6–20)
CO2: 25 mmol/L (ref 22–32)
Calcium: 9.1 mg/dL (ref 8.9–10.3)
Chloride: 105 mmol/L (ref 98–111)
Creatinine, Ser: 0.82 mg/dL (ref 0.44–1.00)
GFR, Estimated: >60 mL/min
Glucose, Bld: 111 mg/dL — ABNORMAL HIGH (ref 70–99)
Potassium: 4 mmol/L (ref 3.5–5.1)
Sodium: 138 mmol/L (ref 135–145)

## 2020-08-07 LAB — GLUCOSE, CAPILLARY: Glucose-Capillary: 111 mg/dL — ABNORMAL HIGH (ref 70–99)

## 2020-08-07 NOTE — Progress Notes (Signed)
PCP - Gaye Alken, NP Cardiologist - denies  Chest x-ray - n/a EKG - 08/07/20 Stress Test - denies ECHO - denies Cardiac Cath - denies  Sleep Study - denies CPAP - denies  Does not check CBG at home. States she's on Trulicity for weight loss. Will collect baseline A1C today.   Blood Thinner Instructions: n/a Aspirin Instructions: n/a  ERAS Protcol - clear liquids until 0645 DOS PRE-SURGERY Ensure or G2- none ordered.  COVID TEST-not needed; ambulatory surgery.    Anesthesia review: No  Patient denies shortness of breath, fever, cough and chest pain at PAT appointment   All instructions explained to the patient, with a verbal understanding of the material. Patient agrees to go over the instructions while at home for a better understanding. Patient also instructed to self quarantine after being tested for COVID-19. The opportunity to ask questions was provided.

## 2020-08-08 LAB — HEMOGLOBIN A1C
Hgb A1c MFr Bld: 6.5 % — ABNORMAL HIGH (ref 4.8–5.6)
Mean Plasma Glucose: 140 mg/dL

## 2020-08-14 ENCOUNTER — Ambulatory Visit
Admission: RE | Admit: 2020-08-14 | Discharge: 2020-08-14 | Disposition: A | Discharge status: Home / Self Care | Payer: Managed Care, Other (non HMO) | Source: Ambulatory Visit | Attending: Surgery | Admitting: Surgery

## 2020-08-14 ENCOUNTER — Other Ambulatory Visit: Payer: Self-pay

## 2020-08-14 DIAGNOSIS — N6489 Other specified disorders of breast: Secondary | ICD-10-CM

## 2020-08-14 NOTE — Anesthesia Preprocedure Evaluation (Addendum)
Anesthesia Evaluation  Patient identified by MRN, date of birth, ID band Patient awake    Reviewed: Allergy & Precautions, NPO status , Patient's Chart, lab work & pertinent test results  Airway Mallampati: III  TM Distance: >3 FB Neck ROM: Full    Dental no notable dental hx. (+) Teeth Intact, Missing, Dental Advisory Given,    Pulmonary former smoker,    Pulmonary exam normal breath sounds clear to auscultation       Cardiovascular negative cardio ROS Normal cardiovascular exam Rhythm:Regular Rate:Normal     Neuro/Psych negative neurological ROS  negative psych ROS   GI/Hepatic Neg liver ROS, GERD  Medicated and Controlled,  Endo/Other  diabetesObesity BMI 38  Renal/GU negative Renal ROS  negative genitourinary   Musculoskeletal negative musculoskeletal ROS (+)   Abdominal (+) + obese,   Peds  Hematology negative hematology ROS (+)   Anesthesia Other Findings Right breast lesion   Reproductive/Obstetrics negative OB ROS S/p hysterectomy                             Anesthesia Physical Anesthesia Plan  ASA: II  Anesthesia Plan: General   Post-op Pain Management:    Induction: Intravenous  PONV Risk Score and Plan: 3 and Dexamethasone, Ondansetron, Midazolam and Treatment may vary due to age or medical condition  Airway Management Planned: LMA  Additional Equipment: None  Intra-op Plan:   Post-operative Plan: Extubation in OR  Informed Consent: I have reviewed the patients History and Physical, chart, labs and discussed the procedure including the risks, benefits and alternatives for the proposed anesthesia with the patient or authorized representative who has indicated his/her understanding and acceptance.     Dental advisory given  Plan Discussed with: CRNA  Anesthesia Plan Comments:        Anesthesia Quick Evaluation

## 2020-08-15 ENCOUNTER — Encounter (HOSPITAL_COMMUNITY)
Admission: RE | Disposition: A | Discharge status: Home / Self Care | Payer: Self-pay | Source: Ambulatory Visit | Attending: Surgery

## 2020-08-15 ENCOUNTER — Ambulatory Visit
Admission: RE | Admit: 2020-08-15 | Discharge: 2020-08-15 | Disposition: A | Discharge status: Home / Self Care | Payer: Managed Care, Other (non HMO) | Source: Ambulatory Visit | Attending: Surgery | Admitting: Surgery

## 2020-08-15 ENCOUNTER — Ambulatory Visit (HOSPITAL_COMMUNITY): Payer: Managed Care, Other (non HMO) | Admitting: Physician Assistant

## 2020-08-15 ENCOUNTER — Other Ambulatory Visit: Payer: Self-pay

## 2020-08-15 ENCOUNTER — Ambulatory Visit (HOSPITAL_COMMUNITY): Payer: Managed Care, Other (non HMO) | Admitting: Anesthesiology

## 2020-08-15 ENCOUNTER — Ambulatory Visit (HOSPITAL_COMMUNITY)
Admission: RE | Admit: 2020-08-15 | Discharge: 2020-08-15 | Disposition: A | Discharge status: Home / Self Care | Payer: Managed Care, Other (non HMO) | Source: Ambulatory Visit | Attending: Surgery | Admitting: Surgery

## 2020-08-15 ENCOUNTER — Encounter (HOSPITAL_COMMUNITY): Payer: Self-pay | Admitting: Surgery

## 2020-08-15 DIAGNOSIS — Z8249 Family history of ischemic heart disease and other diseases of the circulatory system: Secondary | ICD-10-CM | POA: Diagnosis not present

## 2020-08-15 DIAGNOSIS — N6489 Other specified disorders of breast: Secondary | ICD-10-CM

## 2020-08-15 DIAGNOSIS — Z8349 Family history of other endocrine, nutritional and metabolic diseases: Secondary | ICD-10-CM | POA: Insufficient documentation

## 2020-08-15 DIAGNOSIS — Z79899 Other long term (current) drug therapy: Secondary | ICD-10-CM | POA: Diagnosis not present

## 2020-08-15 DIAGNOSIS — K219 Gastro-esophageal reflux disease without esophagitis: Secondary | ICD-10-CM | POA: Insufficient documentation

## 2020-08-15 DIAGNOSIS — Z87891 Personal history of nicotine dependence: Secondary | ICD-10-CM | POA: Diagnosis not present

## 2020-08-15 DIAGNOSIS — N6021 Fibroadenosis of right breast: Secondary | ICD-10-CM | POA: Diagnosis not present

## 2020-08-15 HISTORY — PX: BREAST LUMPECTOMY WITH RADIOACTIVE SEED LOCALIZATION: SHX6424

## 2020-08-15 LAB — GLUCOSE, CAPILLARY
Glucose-Capillary: 133 mg/dL — ABNORMAL HIGH (ref 70–99)
Glucose-Capillary: 116 mg/dL — ABNORMAL HIGH (ref 70–99)

## 2020-08-15 SURGERY — BREAST LUMPECTOMY WITH RADIOACTIVE SEED LOCALIZATION
Anesthesia: General | Site: Breast | Laterality: Right

## 2020-08-15 MED ORDER — PROPOFOL 1000 MG/100ML IV EMUL
INTRAVENOUS | Status: AC
  Filled 2020-08-15: qty 100

## 2020-08-15 MED ORDER — PROPOFOL 10 MG/ML IV BOLUS
INTRAVENOUS | Status: AC
  Filled 2020-08-15: qty 20

## 2020-08-15 MED ORDER — ONDANSETRON HCL 4 MG/2ML IJ SOLN
INTRAMUSCULAR | Status: DC | PRN
  Administered 2020-08-15: 4 mg via INTRAVENOUS

## 2020-08-15 MED ORDER — ACETAMINOPHEN 500 MG PO TABS: 1000.0000 mg | ORAL_TABLET | ORAL | Status: AC

## 2020-08-15 MED ORDER — SCOPOLAMINE 1 MG/3DAYS TD PT72
1.0000 | MEDICATED_PATCH | TRANSDERMAL | Status: DC
  Administered 2020-08-15: 1.5 mg via TRANSDERMAL
  Filled 2020-08-15: qty 1

## 2020-08-15 MED ORDER — OXYCODONE HCL 5 MG PO TABS
5.0000 mg | ORAL_TABLET | Freq: Once | ORAL | Status: DC | PRN
Start: 2020-08-15 — End: 2020-08-15

## 2020-08-15 MED ORDER — CHLORHEXIDINE GLUCONATE 0.12 % MT SOLN
OROMUCOSAL | Status: AC
  Administered 2020-08-15: 15 mL via OROMUCOSAL
  Filled 2020-08-15: qty 15

## 2020-08-15 MED ORDER — FENTANYL CITRATE (PF) 250 MCG/5ML IJ SOLN
INTRAMUSCULAR | Status: AC
  Filled 2020-08-15: qty 5

## 2020-08-15 MED ORDER — LIDOCAINE HCL (CARDIAC) PF 100 MG/5ML IV SOSY
PREFILLED_SYRINGE | INTRAVENOUS | Status: DC | PRN
  Administered 2020-08-15: 60 mg via INTRAVENOUS

## 2020-08-15 MED ORDER — PROMETHAZINE HCL 25 MG/ML IJ SOLN: 6.2500 mg | INTRAMUSCULAR | Status: DC | PRN

## 2020-08-15 MED ORDER — ORAL CARE MOUTH RINSE: 15.0000 mL | Freq: Once | OROMUCOSAL | Status: AC

## 2020-08-15 MED ORDER — LACTATED RINGERS IV SOLN: INTRAVENOUS | Status: DC

## 2020-08-15 MED ORDER — BUPIVACAINE HCL (PF) 0.25 % IJ SOLN
INTRAMUSCULAR | Status: AC
  Filled 2020-08-15: qty 30

## 2020-08-15 MED ORDER — KETOROLAC TROMETHAMINE 30 MG/ML IJ SOLN: 30.0000 mg | Freq: Once | INTRAMUSCULAR | Status: DC | PRN

## 2020-08-15 MED ORDER — PHENYLEPHRINE HCL (PRESSORS) 10 MG/ML IV SOLN
INTRAVENOUS | Status: DC | PRN
  Administered 2020-08-15: 80 ug via INTRAVENOUS

## 2020-08-15 MED ORDER — FENTANYL CITRATE (PF) 100 MCG/2ML IJ SOLN
INTRAMUSCULAR | Status: DC | PRN
  Administered 2020-08-15: 50 ug via INTRAVENOUS

## 2020-08-15 MED ORDER — OXYCODONE HCL 5 MG/5ML PO SOLN: 5.0000 mg | Freq: Once | ORAL | Status: DC | PRN

## 2020-08-15 MED ORDER — CEFAZOLIN SODIUM-DEXTROSE 2-4 GM/100ML-% IV SOLN
INTRAVENOUS | Status: AC
  Filled 2020-08-15: qty 100

## 2020-08-15 MED ORDER — CHLORHEXIDINE GLUCONATE 0.12 % MT SOLN: 15.0000 mL | Freq: Once | OROMUCOSAL | Status: AC

## 2020-08-15 MED ORDER — 0.9 % SODIUM CHLORIDE (POUR BTL) OPTIME
TOPICAL | Status: DC | PRN
  Administered 2020-08-15: 1000 mL

## 2020-08-15 MED ORDER — CEFAZOLIN SODIUM-DEXTROSE 2-4 GM/100ML-% IV SOLN
2.0000 g | INTRAVENOUS | Status: AC
  Administered 2020-08-15: 2 g via INTRAVENOUS

## 2020-08-15 MED ORDER — CHLORHEXIDINE GLUCONATE CLOTH 2 % EX PADS: 6.0000 | MEDICATED_PAD | Freq: Once | CUTANEOUS | Status: DC

## 2020-08-15 MED ORDER — BUPIVACAINE HCL (PF) 0.25 % IJ SOLN
INTRAMUSCULAR | Status: DC | PRN
  Administered 2020-08-15: 10 mL

## 2020-08-15 MED ORDER — MEPERIDINE HCL 25 MG/ML IJ SOLN: 6.2500 mg | INTRAMUSCULAR | Status: DC | PRN

## 2020-08-15 MED ORDER — PROPOFOL 10 MG/ML IV BOLUS
INTRAVENOUS | Status: DC | PRN
  Administered 2020-08-15: 150 mg via INTRAVENOUS
  Administered 2020-08-15: 50 mg via INTRAVENOUS

## 2020-08-15 MED ORDER — EPHEDRINE SULFATE-NACL 50-0.9 MG/10ML-% IV SOSY
PREFILLED_SYRINGE | INTRAVENOUS | Status: DC | PRN
Start: 2020-08-15 — End: 2020-08-15
  Administered 2020-08-15: 5 mg via INTRAVENOUS

## 2020-08-15 MED ORDER — MIDAZOLAM HCL 5 MG/5ML IJ SOLN
INTRAMUSCULAR | Status: DC | PRN
  Administered 2020-08-15: 2 mg via INTRAVENOUS

## 2020-08-15 MED ORDER — LIDOCAINE 2% (20 MG/ML) 5 ML SYRINGE
INTRAMUSCULAR | Status: DC | PRN
  Administered 2020-08-15: 100 mg via INTRAVENOUS

## 2020-08-15 MED ORDER — ACETAMINOPHEN 500 MG PO TABS
ORAL_TABLET | ORAL | Status: AC
  Administered 2020-08-15: 1000 mg via ORAL
  Filled 2020-08-15: qty 2

## 2020-08-15 MED ORDER — MIDAZOLAM HCL 2 MG/2ML IJ SOLN
INTRAMUSCULAR | Status: AC
  Filled 2020-08-15: qty 2

## 2020-08-15 MED ORDER — DEXAMETHASONE SODIUM PHOSPHATE 4 MG/ML IJ SOLN
INTRAMUSCULAR | Status: DC | PRN
  Administered 2020-08-15: 10 mg via INTRAVENOUS

## 2020-08-15 MED ORDER — HYDROMORPHONE HCL 1 MG/ML IJ SOLN: 0.2500 mg | INTRAMUSCULAR | Status: DC | PRN

## 2020-08-15 SURGICAL SUPPLY — 42 items
APPLIER CLIP 9.375 MED OPEN (MISCELLANEOUS)
BENZOIN TINCTURE PRP APPL 2/3 (GAUZE/BANDAGES/DRESSINGS) ×2 IMPLANT
BINDER BREAST LRG (GAUZE/BANDAGES/DRESSINGS) IMPLANT
BINDER BREAST XLRG (GAUZE/BANDAGES/DRESSINGS) ×2 IMPLANT
CANISTER SUCT 3000ML PPV (MISCELLANEOUS) ×2 IMPLANT
CHLORAPREP W/TINT 26 (MISCELLANEOUS) ×2 IMPLANT
CLIP APPLIE 9.375 MED OPEN (MISCELLANEOUS) IMPLANT
CLSR STERI-STRIP ANTIMIC 1/2X4 (GAUZE/BANDAGES/DRESSINGS) ×2 IMPLANT
COVER PROBE W GEL 5X96 (DRAPES) ×2 IMPLANT
COVER SURGICAL LIGHT HANDLE (MISCELLANEOUS) ×2 IMPLANT
COVER WAND RF STERILE (DRAPES) ×2 IMPLANT
DEVICE DUBIN SPECIMEN MAMMOGRA (MISCELLANEOUS) ×2 IMPLANT
DRAPE CHEST BREAST 15X10 FENES (DRAPES) ×2 IMPLANT
DRSG TEGADERM 4X10 (GAUZE/BANDAGES/DRESSINGS) ×2 IMPLANT
DRSG TEGADERM 4X4.75 (GAUZE/BANDAGES/DRESSINGS) ×2 IMPLANT
ELECT CAUTERY BLADE 6.4 (BLADE) ×2 IMPLANT
ELECT REM PT RETURN 9FT ADLT (ELECTROSURGICAL) ×2
ELECTRODE REM PT RTRN 9FT ADLT (ELECTROSURGICAL) ×1 IMPLANT
GAUZE SPONGE 2X2 8PLY NS (GAUZE/BANDAGES/DRESSINGS) ×2 IMPLANT
GAUZE SPONGE 2X2 8PLY STRL LF (GAUZE/BANDAGES/DRESSINGS) ×1 IMPLANT
GLOVE BIO SURGEON STRL SZ7 (GLOVE) ×2 IMPLANT
GLOVE BIOGEL PI IND STRL 7.5 (GLOVE) ×1 IMPLANT
GLOVE BIOGEL PI INDICATOR 7.5 (GLOVE) ×1
GOWN STRL REUS W/ TWL LRG LVL3 (GOWN DISPOSABLE) ×2 IMPLANT
GOWN STRL REUS W/TWL LRG LVL3 (GOWN DISPOSABLE) ×2
ILLUMINATOR WAVEGUIDE N/F (MISCELLANEOUS) IMPLANT
KIT BASIN OR (CUSTOM PROCEDURE TRAY) ×2 IMPLANT
KIT MARKER MARGIN INK (KITS) ×2 IMPLANT
LIGHT WAVEGUIDE WIDE FLAT (MISCELLANEOUS) IMPLANT
NEEDLE HYPO 25GX1X1/2 BEV (NEEDLE) ×2 IMPLANT
NS IRRIG 1000ML POUR BTL (IV SOLUTION) ×2 IMPLANT
PACK GENERAL/GYN (CUSTOM PROCEDURE TRAY) ×2 IMPLANT
SPONGE GAUZE 2X2 STER 10/PKG (GAUZE/BANDAGES/DRESSINGS) ×1
SPONGE LAP 4X18 RFD (DISPOSABLE) ×2 IMPLANT
STRIP CLOSURE SKIN 1/2X4 (GAUZE/BANDAGES/DRESSINGS) ×2 IMPLANT
SUT MNCRL AB 4-0 PS2 18 (SUTURE) ×2 IMPLANT
SUT SILK 2 0 SH (SUTURE) IMPLANT
SUT VIC AB 3-0 SH 27 (SUTURE) ×1
SUT VIC AB 3-0 SH 27X BRD (SUTURE) ×1 IMPLANT
SYR CONTROL 10ML LL (SYRINGE) ×2 IMPLANT
TOWEL GREEN STERILE (TOWEL DISPOSABLE) ×2 IMPLANT
TOWEL GREEN STERILE FF (TOWEL DISPOSABLE) ×2 IMPLANT

## 2020-08-15 NOTE — Anesthesia Postprocedure Evaluation (Signed)
Anesthesia Post Note  Patient: Tammy Christensen  Procedure(s) Performed: RIGHT BREAST LUMPECTOMY WITH RADIOACTIVE SEED LOCALIZATION (Right Breast)     Patient location during evaluation: PACU Anesthesia Type: General Level of consciousness: awake and alert, oriented and patient cooperative Pain management: pain level controlled Vital Signs Assessment: post-procedure vital signs reviewed and stable Respiratory status: spontaneous breathing, nonlabored ventilation and respiratory function stable Cardiovascular status: blood pressure returned to baseline and stable Postop Assessment: no apparent nausea or vomiting Anesthetic complications: no   No complications documented.  Last Vitals:  Vitals:   08/15/20 0802  BP: (!) 146/86  Pulse: 77  Resp: 18  Temp: 36.9 C    Last Pain:  Vitals:   08/15/20 0815  TempSrc:   PainSc: 0-No pain                 Lannie Fields

## 2020-08-15 NOTE — Discharge Instructions (Addendum)
[facility] °Office Phone Number [phone] ° °BREAST BIOPSY/ PARTIAL MASTECTOMY: POST OP INSTRUCTIONS ° °Always review your discharge instruction sheet given to you by the facility where your surgery was performed. ° °IF YOU HAVE DISABILITY OR FAMILY LEAVE FORMS, YOU MUST BRING THEM TO THE OFFICE FOR PROCESSING.  DO NOT GIVE THEM TO YOUR DOCTOR. ° °1. A prescription for pain medication may be given to you upon discharge.  Take your pain medication as prescribed, if needed.  If narcotic pain medicine is not needed, then you may take acetaminophen (Tylenol) or ibuprofen (Advil) as needed. °2. Take your usually prescribed medications unless otherwise directed °3. If you need a refill on your pain medication, please contact your pharmacy.  They will contact our office to request authorization.  Prescriptions will not be filled after 5pm or on week-ends. °4. You should eat very light the first 24 hours after surgery, such as soup, crackers, pudding, etc.  Resume your normal diet the day after surgery. °5. Most patients will experience some swelling and bruising in the breast.  Ice packs and a good support bra will help.  Swelling and bruising can take several days to resolve.  °6. It is common to experience some constipation if taking pain medication after surgery.  Increasing fluid intake and taking a stool softener will usually help or prevent this problem from occurring.  A mild laxative (Milk of Magnesia or Miralax) should be taken according to package directions if there are no bowel movements after 48 hours. °7. Unless discharge instructions indicate otherwise, you may remove your bandages 24-48 hours after surgery, and you may shower at that time.  You may have steri-strips (small skin tapes) in place directly over the incision.  These strips should be left on the skin for 7-10 days.  If your surgeon used skin glue on the incision, you may shower in 24 hours.  The glue will flake off over the  next 2-3 weeks.  Any sutures or staples will be removed at the office during your follow-up visit. °8. ACTIVITIES:  You may resume regular daily activities (gradually increasing) beginning the next day.  Wearing a good support bra or sports bra minimizes pain and swelling.  You may have sexual intercourse when it is comfortable. °a. You may drive when you no longer are taking prescription pain medication, you can comfortably wear a seatbelt, and you can safely maneuver your car and apply brakes. °b. RETURN TO WORK:  ______________________________________________________________________________________ °9. You should see your doctor in the office for a follow-up appointment approximately two weeks after your surgery.  Your doctor’s nurse will typically make your follow-up appointment when she calls you with your pathology report.  Expect your pathology report 2-3 business days after your surgery.  You may call to check if you do not hear from us after three days. °10. OTHER INSTRUCTIONS: _______________________________________________________________________________________________ _____________________________________________________________________________________________________________________________________ °_____________________________________________________________________________________________________________________________________ °_____________________________________________________________________________________________________________________________________ ° °WHEN TO CALL YOUR DOCTOR: °1. Fever over 101.0 °2. Nausea and/or vomiting. °3. Extreme swelling or bruising. °4. Continued bleeding from incision. °5. Increased pain, redness, or drainage from the incision. ° °The clinic staff is available to answer your questions during regular business hours.  Please don’t hesitate to call and ask to speak to one of the nurses for clinical concerns.  If you have a medical emergency, go to the nearest  emergency room or call 911.  A surgeon from [facility] is always on call at the hospital. ° °For further questions, please visit [email or web address]  °

## 2020-08-15 NOTE — Transfer of Care (Signed)
Immediate Anesthesia Transfer of Care Note  Patient: Tammy Christensen  Procedure(s) Performed: RIGHT BREAST LUMPECTOMY WITH RADIOACTIVE SEED LOCALIZATION (Right Breast)  Patient Location: PACU  Anesthesia Type:General  Level of Consciousness: awake  Airway & Oxygen Therapy: Patient Spontanous Breathing and Patient connected to nasal cannula oxygen  Post-op Assessment: Report given to RN and Post -op Vital signs reviewed and stable  Post vital signs: Reviewed and stable  Last Vitals:  Vitals Value Taken Time  BP 116/87 08/15/20 1115  Temp    Pulse 87 08/15/20 1119  Resp 13 08/15/20 1119  SpO2 100 % 08/15/20 1119  Vitals shown include unvalidated device data.  Last Pain:  Vitals:   08/15/20 0815  TempSrc:   PainSc: 0-No pain         Complications: No complications documented.

## 2020-08-15 NOTE — Interval H&P Note (Signed)
History and Physical Interval Note:  08/15/2020 9:38 AM  Tammy Christensen  has presented today for surgery, with the diagnosis of RIGHT BREAST COMPLEX SCLEROSING LESION.  The various methods of treatment have been discussed with the patient and family. After consideration of risks, benefits and other options for treatment, the patient has consented to  Procedure(s): RIGHT BREAST LUMPECTOMY WITH RADIOACTIVE SEED LOCALIZATION (Right) as a surgical intervention.  The patient's history has been reviewed, patient examined, no change in status, stable for surgery.  I have reviewed the patient's chart and labs.  Questions were answered to the patient's satisfaction.     Wynona Luna

## 2020-08-15 NOTE — Op Note (Signed)
Pre-op Diagnosis:  Right breast complex sclerosing lesion  Post-op Diagnosis: same Procedure:  Right radioactive seed localized lumpectomy Surgeon:  Keilen Kahl K. Anesthesia:  GEN - LMA Indications:  This is a 50 year old female in good health who presents after a recent screening mammogram at [facility] revealed a possible mass in the right lateral breast. She came to [facility] for further work-up. Biopsy revealed a complex sclerosing lesion without sign of any malignancy. No family history of breast cancer. No previous breast problems. She presents now to discuss excision of this area.  Description of procedure: The patient is brought to the operating room placed in supine position on the operating room table. After an adequate level of general anesthesia was obtained, her right breast was prepped with ChloraPrep and draped in sterile fashion. A timeout was taken to ensure the proper patient and proper procedure. We interrogated the breast with the neoprobe. We made a transverse incision in the lateral right breast after infiltrating with 0.25% Marcaine. Dissection was carried down in the breast tissue with cautery. We used the neoprobe to guide [place] towards the radioactive seed. We excised an area of tissue around the radioactive seed 1.5 cm in diameter. The specimen was removed and was oriented with a paint kit. Specimen mammogram showed the radioactive seed as well as the biopsy clip within the specimen. This was sent for pathologic examination. There is no residual radioactivity within the biopsy cavity. We inspected carefully for hemostasis. The wound was thoroughly irrigated. The wound was closed with a deep layer of 3-0 Vicryl and a subcuticular layer of 4-0 Monocryl. Benzoin Steri-Strips were applied. The patient was then extubated and brought to the recovery room in stable condition. All sponge, instrument, and needle counts are correct.  Imogene Burn. Georgette Dover, MD, [facility]   General/ Trauma Surgery  08/15/2020 10:58 AM

## 2020-08-15 NOTE — Anesthesia Procedure Notes (Signed)
Procedure Name: LMA Insertion Date/Time: 08/15/2020 10:32 AM Performed by: Elgie Congo, CRNA Pre-anesthesia Checklist: Patient identified, Emergency Drugs available, Suction available and Patient being monitored Patient Re-evaluated:Patient Re-evaluated prior to induction Oxygen Delivery Method: Circle system utilized Preoxygenation: Pre-oxygenation with 100% oxygen Induction Type: IV induction Ventilation: Mask ventilation without difficulty LMA: LMA inserted LMA Size: 4.0 Number of attempts: 1 Airway Equipment and Method: Oral airway Placement Confirmation: positive ETCO2 and breath sounds checked- equal and bilateral Tube secured with: Tape Dental Injury: Teeth and Oropharynx as per pre-operative assessment

## 2020-08-16 ENCOUNTER — Encounter (HOSPITAL_COMMUNITY): Payer: Self-pay | Admitting: Surgery

## 2020-08-16 LAB — SURGICAL PATHOLOGY

## 2021-06-25 ENCOUNTER — Encounter (HOSPITAL_COMMUNITY): Payer: Self-pay

## 2022-05-16 ENCOUNTER — Ambulatory Visit (INDEPENDENT_AMBULATORY_CARE_PROVIDER_SITE_OTHER): Payer: Managed Care, Other (non HMO) | Admitting: Podiatry

## 2022-05-16 ENCOUNTER — Other Ambulatory Visit: Payer: Self-pay | Admitting: Podiatry

## 2022-05-16 ENCOUNTER — Ambulatory Visit (INDEPENDENT_AMBULATORY_CARE_PROVIDER_SITE_OTHER): Payer: Managed Care, Other (non HMO)

## 2022-05-16 DIAGNOSIS — T148XXA Other injury of unspecified body region, initial encounter: Secondary | ICD-10-CM | POA: Diagnosis not present

## 2022-05-16 DIAGNOSIS — S99921A Unspecified injury of right foot, initial encounter: Secondary | ICD-10-CM

## 2022-05-16 MED ORDER — METHYLPREDNISOLONE 4 MG PO TBPK: ORAL_TABLET | ORAL | 0 refills | Status: AC

## 2022-05-16 NOTE — Progress Notes (Signed)
  Subjective:  Patient ID: Tammy Christensen, female    DOB: [birth year],  MRN: [id number]  Chief Complaint  Patient presents with   contusion of foot    NP - Patient dropped her cell phone on her right foot on Monday, she could not move or bend her toes after -went to [facility] Wednesday    51 y.o. female presents with concern for pain in the top of the left foot.  As well as around the first MPJ.  She dropped her phone on her foot Monday.  She went to urgent care Wednesday.  They took x-rays told that nothing was broken.  Gave her a postop shoe hydrocodone and naproxen.  She says that since taking the naproxen the pain has been decreasing.  Additionally she has been icing which has been helping as well.  Past Medical History:  Diagnosis Date   Colitis    Colon polyp    Diabetes ([place])    Hyperlipidemia    Obesity    Pneumonia     No Known Allergies  ROS: Negative except as per HPI above  Objective:  General: AAO x3, NAD  Dermatological: With inspection and palpation of the right and left lower extremities there are no open sores, no preulcerative lesions, no rash or signs of infection present. Nails are of normal length thickness and coloration.   Vascular:  Dorsalis Pedis artery and Posterior Tibial artery pedal pulses are 2/4 bilateral.  Capillary fill time < 3 sec to all digits.   Neruologic: Grossly intact via light touch bilateral. Protective threshold intact to all sites bilateral.   Musculoskeletal: Mild edema of the left forefoot.  Pain with palpation about the first through third metatarsal heads with focal edema surrounding.  Decreased motion of the hallux and lesser digits which is limited by pain.  Gait: Unassisted, Nonantalgic.   No images are attached to the encounter.  Radiographs:  Date: 05/16/2022 XR the left foot Weightbearing AP/Lateral/Oblique   Findings: No fracture identified in the left foot.  Soft tissue edema is noted in the left forefoot Assessment:    1. Contusion of bone   2. Foot injury, right, initial encounter      Plan:  Patient was evaluated and treated and all questions answered.  # Contusion of bone first through third metatarsal left foot -Discussed with patient that she does not appear to have any fracture in the left forefoot. -Believe that the injury is self-limited.  She does likely not have any damage to the extensor tendons with a blunt injury from dropping her phone.  It is likely the limited range of motion is related to pain and swelling at this time -I recommend treatment with a steroid Dosepak methylprednisolone 4 mg steroid taper pack take as directed for 6 days was prescribed to the patient -Recommend continued use of the postoperative shoe for 2 weeks -Recommend continued use of icing compression and naproxen anti-inflammatory modalities. -Patient will follow-up if the issue does not resolve in 3 to 4 weeks at which time she should regain more motion in her toes.  No follow-ups on file.          Everitt Amber, DPM [facility]

## 2022-06-18 IMAGING — MG MM PLC BREAST LOC DEV 1ST LESION INC*R*
7 series · 7 of 7 positions shown · non-contrast
Comparison: Previous exam(s).

CLINICAL DATA: 50-year-old female for radioactive seed localization
of RIGHT breast complex sclerosing lesion prior to excision.

EXAM:
MAMMOGRAPHIC GUIDED RADIOACTIVE SEED LOCALIZATION OF THE RIGHT
BREAST

[R LM (1 of 4)]
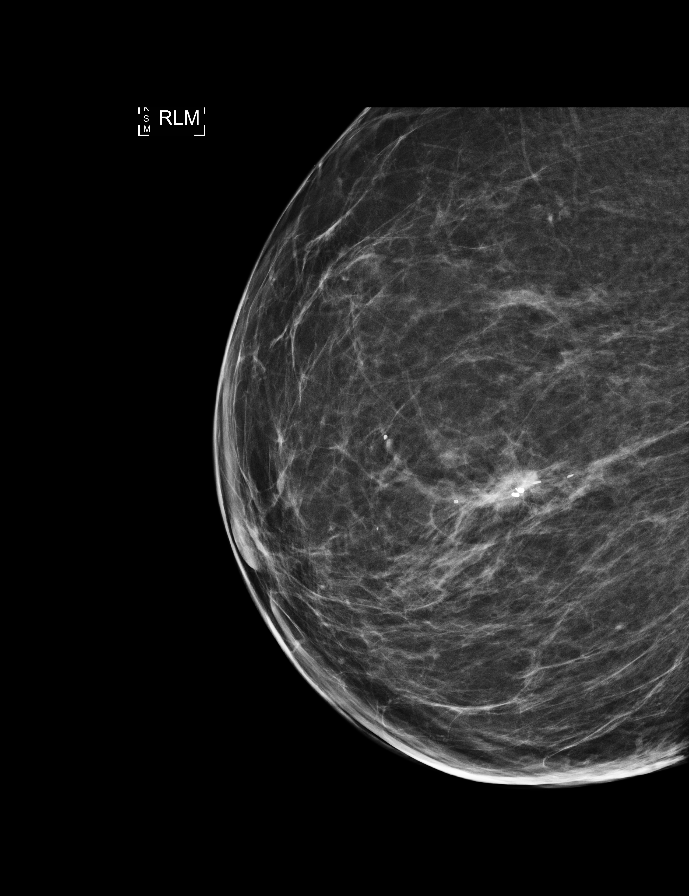

[R LM (2 of 4)]
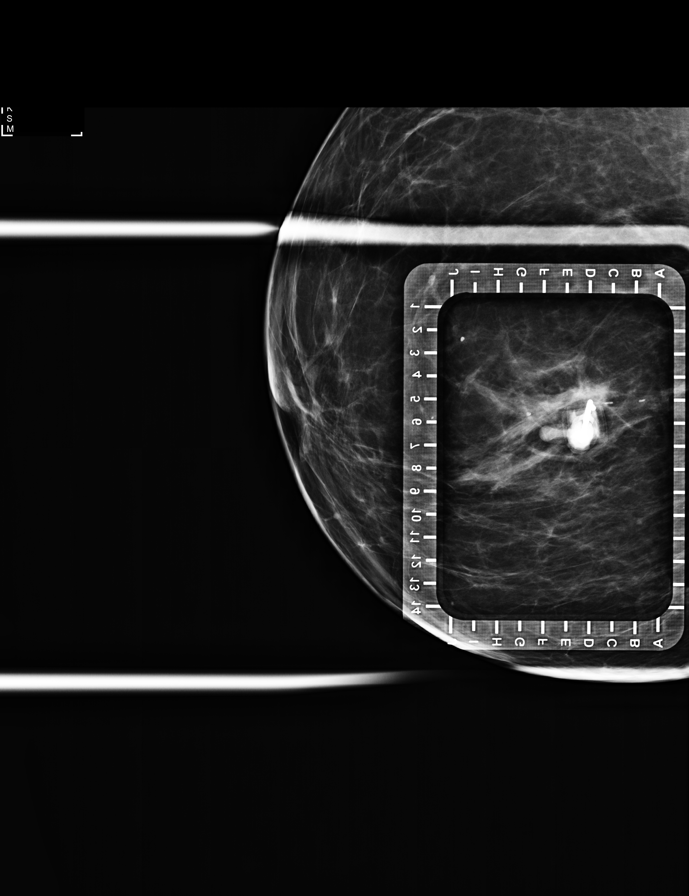

[R LM (3 of 4)]
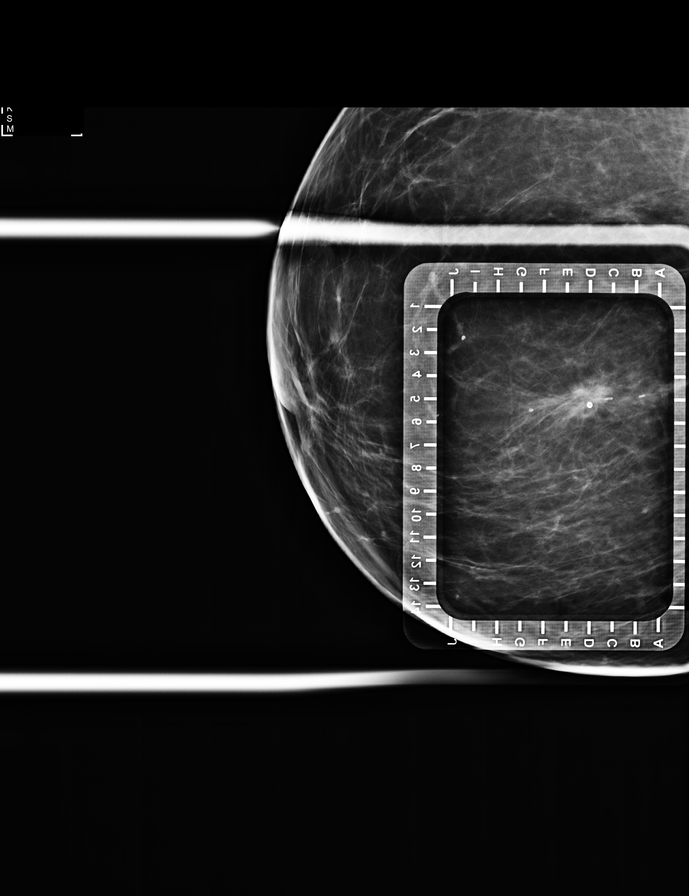

[R CC (1 of 3)]
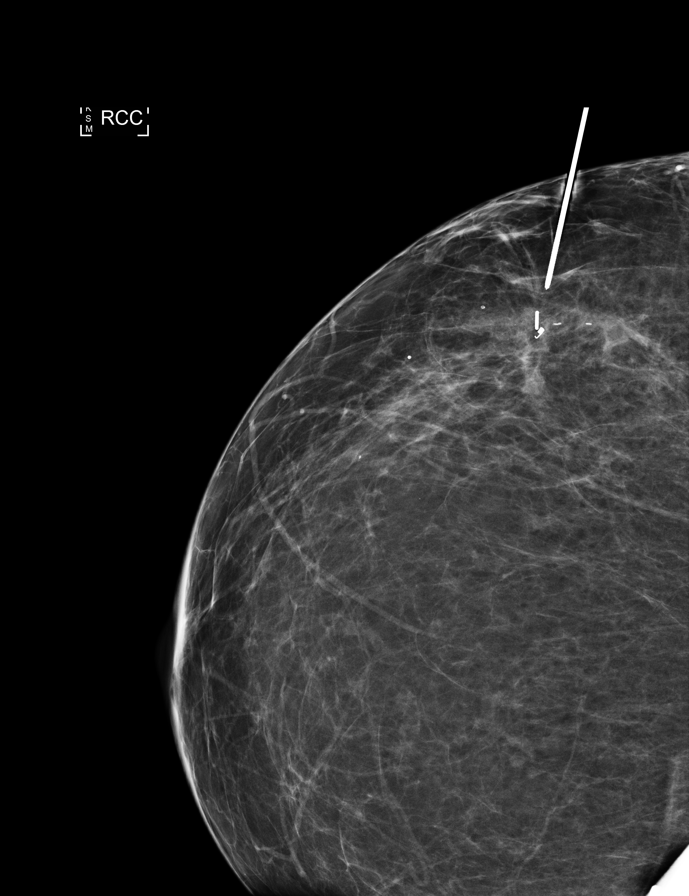

[R LM (4 of 4)]
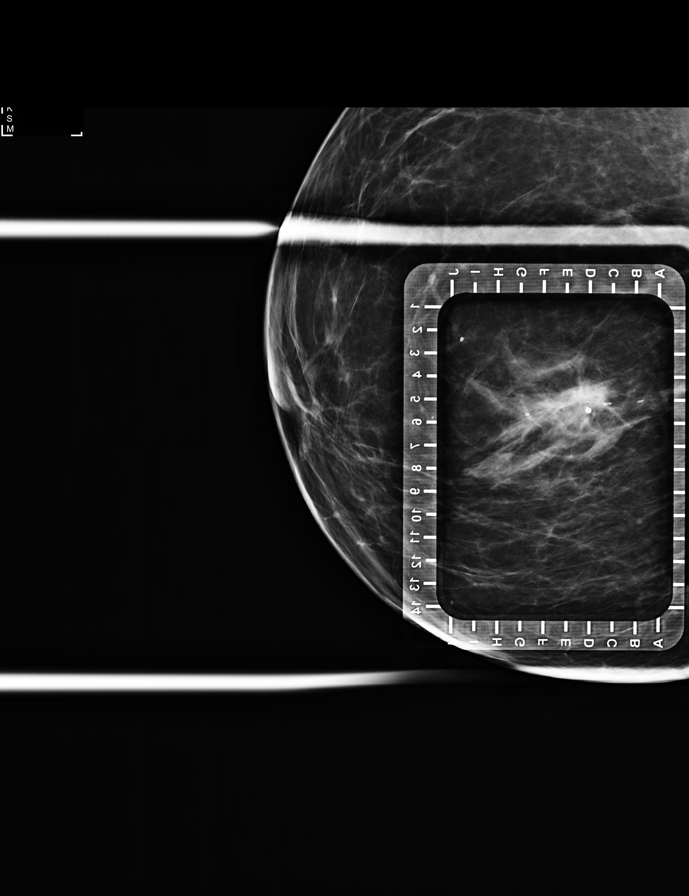

[R CC (2 of 3)]
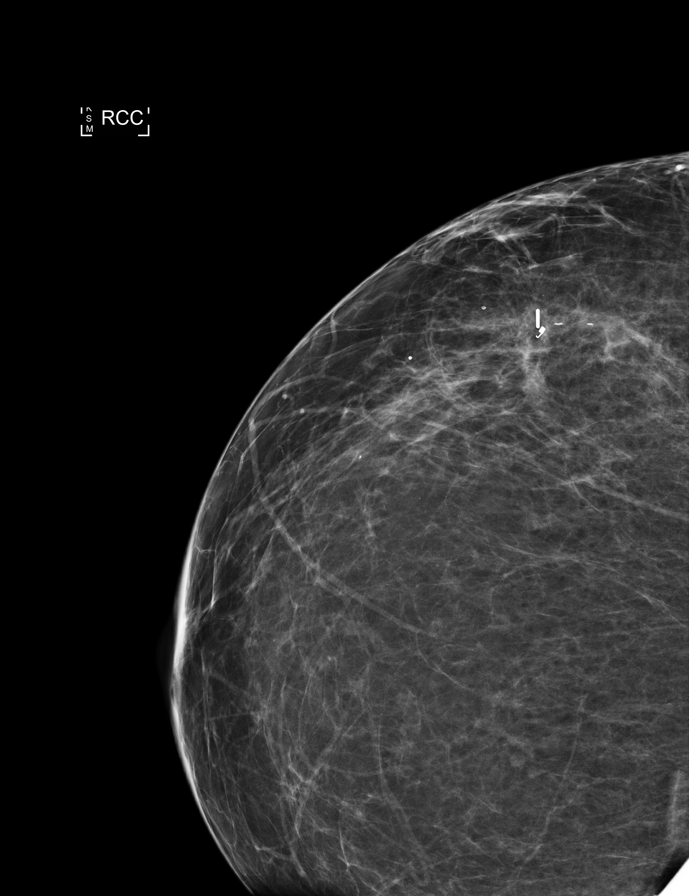

[R CC (3 of 3)]
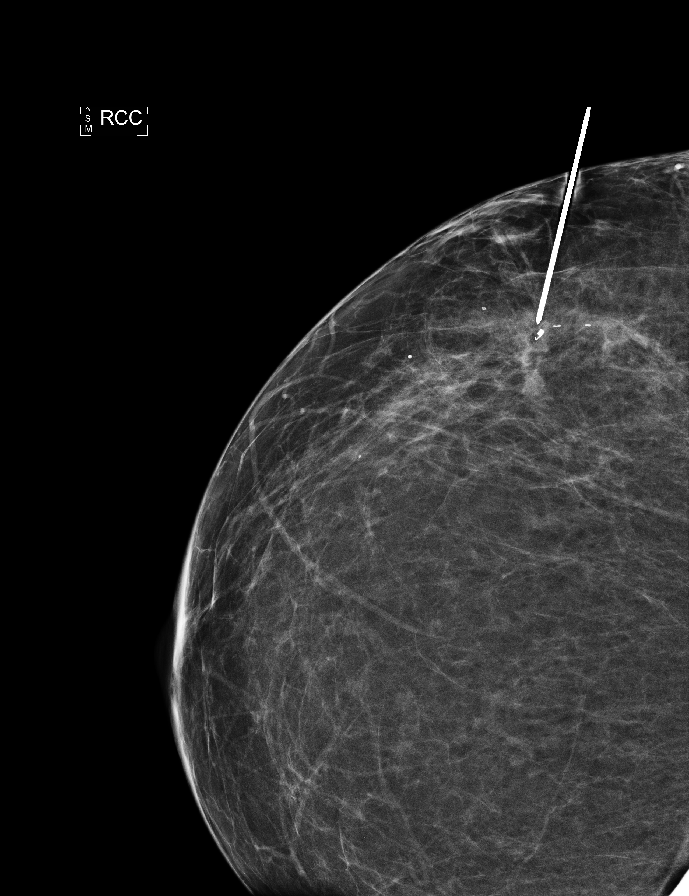

[7 of 7 positions shown; findings below may reference images not displayed]

FINDINGS: Patient presents for radioactive seed localization prior to RIGHT
breast excision. I met with the patient and we discussed the
procedure of seed localization including benefits and alternatives.
We discussed the high likelihood of a successful procedure. We
discussed the risks of the procedure including infection, bleeding,
tissue injury and further surgery. We discussed the low dose of
radioactivity involved in the procedure. Informed, written consent
was given.

The usual time-out protocol was performed immediately prior to the
procedure.

Using mammographic guidance, sterile technique, 1% lidocaine and an
3-RXU radioactive seed, the COIL clip was localized using a LATERAL
approach, with the seed placed just LATERAL to the clip. The
follow-up mammogram images confirm the seed in the expected location
and were marked for Dr. Nydia.

Follow-up survey of the patient confirms presence of the radioactive
seed.

Order number of [id number] seed:  [id number].

Total activity:  0.257 millicuries.  Reference Date: 08/03/2020

The patient tolerated the procedure well and was released from the
[REDACTED]. She was given instructions regarding seed removal.
IMPRESSION: Radioactive seed localization RIGHT breast. No apparent
complications.

## 2022-06-19 IMAGING — MG MM BREAST SURGICAL SPECIMEN
1 series · 1 of 1 positions shown · non-contrast
Comparison: Previous exam(s).

CLINICAL DATA: Status post surgical excision today after earlier
radioactive seed localization.

EXAM:
SPECIMEN RADIOGRAPH OF THE RIGHT BREAST

[R]
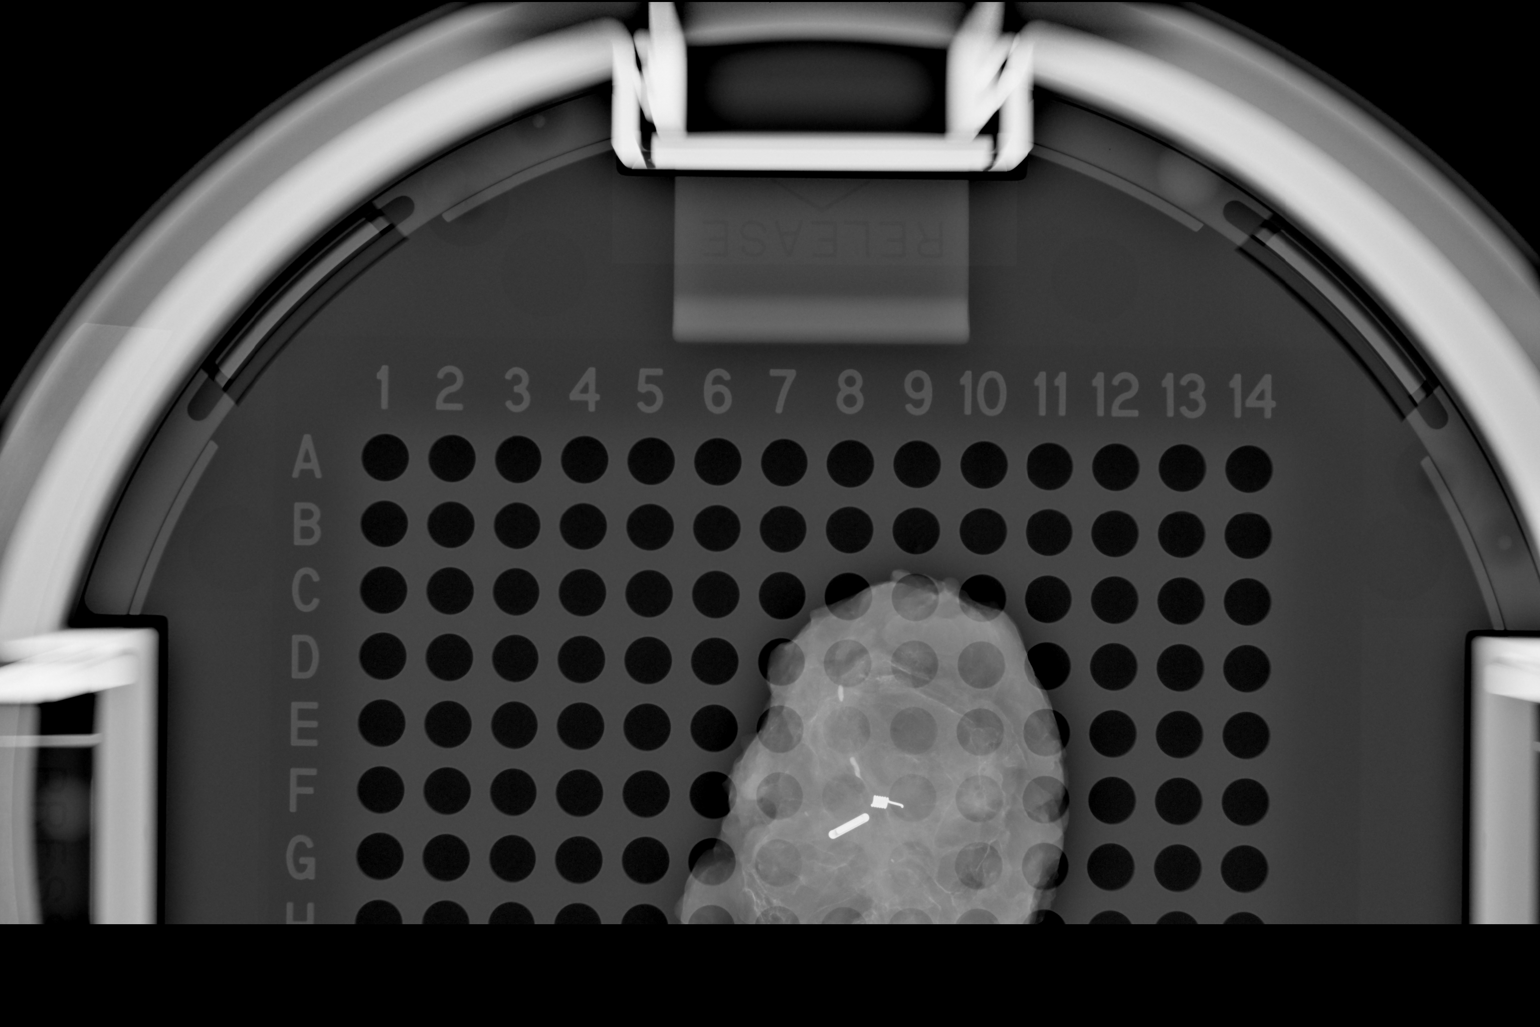

[1 of 1 positions shown; findings below may reference images not displayed]

FINDINGS: Status post excision of the right breast. The radioactive seed and
biopsy marker clip are present, completely intact, and were marked
for pathology. Findings discussed with the OR staff during the
procedure.
IMPRESSION: Specimen radiograph of the right breast.
